# Patient Record
Sex: Female | Born: 1945 | Race: Black or African American | Hispanic: No | Marital: Single
Health system: Southern US, Community
[De-identification: ages and names within clinical notes are randomized; demographics above are authoritative.]

## PROBLEM LIST (undated history)

## (undated) DIAGNOSIS — I82419 Acute embolism and thrombosis of unspecified femoral vein: Secondary | ICD-10-CM

---

## 2004-02-10 ENCOUNTER — Inpatient Hospital Stay (HOSPITAL_COMMUNITY): Admission: EM | Admit: 2004-02-10 | Discharge: 2004-02-12 | Payer: Self-pay | Admitting: Emergency Medicine

## 2019-12-06 ENCOUNTER — Emergency Department (HOSPITAL_COMMUNITY): Payer: Medicare Other

## 2019-12-06 ENCOUNTER — Emergency Department (HOSPITAL_COMMUNITY)
Admission: EM | Admit: 2019-12-06 | Discharge: 2019-12-06 | Disposition: A | Discharge status: Home / Self Care | Payer: Medicare Other | Attending: Emergency Medicine | Admitting: Emergency Medicine

## 2019-12-06 ENCOUNTER — Encounter (HOSPITAL_COMMUNITY): Payer: Self-pay | Admitting: Obstetrics and Gynecology

## 2019-12-06 DIAGNOSIS — S82831A Other fracture of upper and lower end of right fibula, initial encounter for closed fracture: Secondary | ICD-10-CM

## 2019-12-06 DIAGNOSIS — S99922A Unspecified injury of left foot, initial encounter: Secondary | ICD-10-CM | POA: Diagnosis present

## 2019-12-06 DIAGNOSIS — W19XXXA Unspecified fall, initial encounter: Secondary | ICD-10-CM | POA: Insufficient documentation

## 2019-12-06 HISTORY — DX: Acute embolism and thrombosis of unspecified femoral vein: I82.419

## 2019-12-06 MED ORDER — HYDROCODONE-ACETAMINOPHEN 5-325 MG PO TABS: 1.0000 | ORAL_TABLET | ORAL | 0 refills | Status: AC | PRN

## 2019-12-06 NOTE — Discharge Instructions (Signed)
You were evaluated in the Emergency Department and after careful evaluation, we did not find any emergent condition requiring admission or further testing in the hospital.  Your exam/testing today was overall reassuring.  Your x-ray shows a small broken bone in your right ankle.  We recommend follow-up with the orthopedic specialist here in town to make sure this heals well.  Please use the air brace throughout the day to keep the ankle well aligned.  You can use the Norco pain medicine at night if you are having trouble sleeping due to the pain.  Please return to the Emergency Department if you experience any worsening of your condition.  Thank you for allowing [place] to be a part of your care.

## 2019-12-06 NOTE — ED Triage Notes (Signed)
Patient reports to the ER for fall. Patient reports she twisted her right ankle and hit her right foot on the step. Patient has obvious swelling to right ankle and pain in the left foot.

## 2019-12-06 NOTE — ED Provider Notes (Signed)
[facility] Emergency Department Provider Note MRN:  [id number]  Arrival date & time: 12/06/19     Chief Complaint   Fall and Ankle Pain   History of Present Illness   Barbara Adams is a 74 y.o. year-old female with a history of DVT presenting to the ED with chief complaint of fall.  Patient fell 3 days ago, stumbled and lost her footing and fell awkwardly down 1 stair.  Has been having persistent pain to the right ankle since then, mild to moderate, also with very mild pain to the left foot yesterday but not today.  Pain is worse with motion or palpation.  No other trauma, no head trauma, no dizziness preceding the fall.  Review of Systems  A problem-focused ROS was performed. Positive for ankle pain.  Patient denies head trauma.  Patient's Health History    Past Medical History:  Diagnosis Date  . 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  Social History   Socioeconomic History  . Marital status: Single    Spouse name: Not on file  . Number of children: Not on file  . Years of education: Not on file  . Highest education level: Not on file  Occupational History  . Not on file  Tobacco Use  . Smoking status: Not on file  Substance and Sexual Activity  . Alcohol use: Not on file  . Drug use: Not on file  . Sexual activity: Not on file  Other Topics Concern  . Not on file  Social History Narrative  . Not on file   Social Determinants of Health   Financial Resource Strain:   . Difficulty of Paying Living Expenses: Not on file  Food Insecurity:   . Worried About Programme researcher, broadcasting/film/video in the Last Year: Not on file  . Ran Out of Food in the Last Year: Not on file  Transportation Needs:   . Lack of Transportation (Medical): Not on file  . Lack of Transportation (Non-Medical): Not on file  Physical Activity:   . Days of Exercise per Week: Not on file  . Minutes of Exercise per Session: Not  on file  Stress:   . Feeling of Stress : Not on file  Social Connections:   . Frequency of Communication with Friends and Family: Not on file  . Frequency of Social Gatherings with Friends and Family: Not on file  . Attends Religious Services: Not on file  . Active Member of Clubs or Organizations: Not on file  . Attends Banker Meetings: Not on file  . Marital Status: Not on file  Intimate Partner Violence:   . Fear of Current or Ex-Partner: Not on file  . Emotionally Abused: Not on file  . Physically Abused: Not on file  . Sexually Abused: Not on file     Physical Exam   Vitals:   12/06/19 1341  BP: 128/83  Pulse: 72  Resp: 16  Temp: 98.1 F (36.7 C)  SpO2: 100%    CONSTITUTIONAL: Well-appearing, NAD NEURO:  Alert and oriented x 3, no focal deficits EYES:  eyes equal and reactive ENT/NECK:  no LAD, no JVD CARDIO: Regular rate, well-perfused, normal S1 and S2 PULM:  CTAB no wheezing or rhonchi GI/GU:  normal bowel sounds, non-distended, non-tender MSK/SPINE: Mild swelling and tenderness palpation to the right lateral malleolus SKIN:  no rash, atraumatic PSYCH:  Appropriate speech and behavior  *  Additional and/or pertinent findings included in MDM below  Diagnostic and Interventional Summary    EKG Interpretation  Date/Time:    Ventricular Rate:    PR Interval:    QRS Duration:   QT Interval:    QTC Calculation:   R Axis:     Text Interpretation:        Labs Reviewed - No data to display  DG Foot Complete Left  Final Result    DG Foot Complete Right  Final Result    DG Ankle Complete Right  Final Result      Medications - No data to display   Procedures  /  Critical Care Procedures  ED Course and Medical Decision Making  I have reviewed the triage vital signs, the nursing notes, and pertinent available records from the EMR.  Listed above are laboratory and imaging tests that I personally ordered, reviewed, and interpreted and  then considered in my medical decision making (see below for details).  Mechanical fall with x-ray revealing nondisplaced distal fibular fracture, neurovascularly intact extremity, appropriate for discharge with Ortho follow-up.  Patient is able to bear weight without much pain, placing an air brace/cast.       Elmer Sow. Pilar Plate, MD [facility] Emergency Medicine [facility] [email or web address]  Final Clinical Impressions(s) / ED Diagnoses     ICD-10-CM   1. Closed fracture of distal end of right fibula, unspecified fracture morphology, initial encounter  S82.831A   2. Fall  [email or web address]     ED Discharge Orders         Ordered    HYDROcodone-acetaminophen (NORCO/VICODIN) 5-325 MG tablet  Every 4 hours PRN        12/06/19 1645           Discharge Instructions Discussed with and Provided to Patient:     Discharge Instructions     You were evaluated in the Emergency Department and after careful evaluation, we did not find any emergent condition requiring admission or further testing in the hospital.  Your exam/testing today was overall reassuring.  Your x-ray shows a small broken bone in your right ankle.  We recommend follow-up with the orthopedic specialist here in town to make sure this heals well.  Please use the air brace throughout the day to keep the ankle well aligned.  You can use the Norco pain medicine at night if you are having trouble sleeping due to the pain.  Please return to the Emergency Department if you experience any worsening of your condition.  Thank you for allowing [place] to be a part of your care.       Sabas Sous, MD 12/06/19 [phone]

## 2019-12-06 NOTE — Progress Notes (Signed)
Orthopedic Tech Progress Note Patient Details:  Barbara Adams 03-18-1945 [id number]  Ortho Devices Ortho Device/Splint Location: applied cam walker RLE Ortho Device/Splint Interventions: Ordered, Application   Post Interventions Patient Tolerated: Well Instructions Provided: Care of device   Jennye Moccasin 12/06/2019, 5:08 PM

## 2021-10-12 IMAGING — CR DG FOOT COMPLETE 3+V*R*
3 series · 3 of 3 positions shown · non-contrast
Comparison: None.

CLINICAL DATA: Right foot pain after a twisting injury and fall on
step. Initial encounter.

EXAM:
RIGHT FOOT COMPLETE - 3+ VIEW

[x foot ap right]
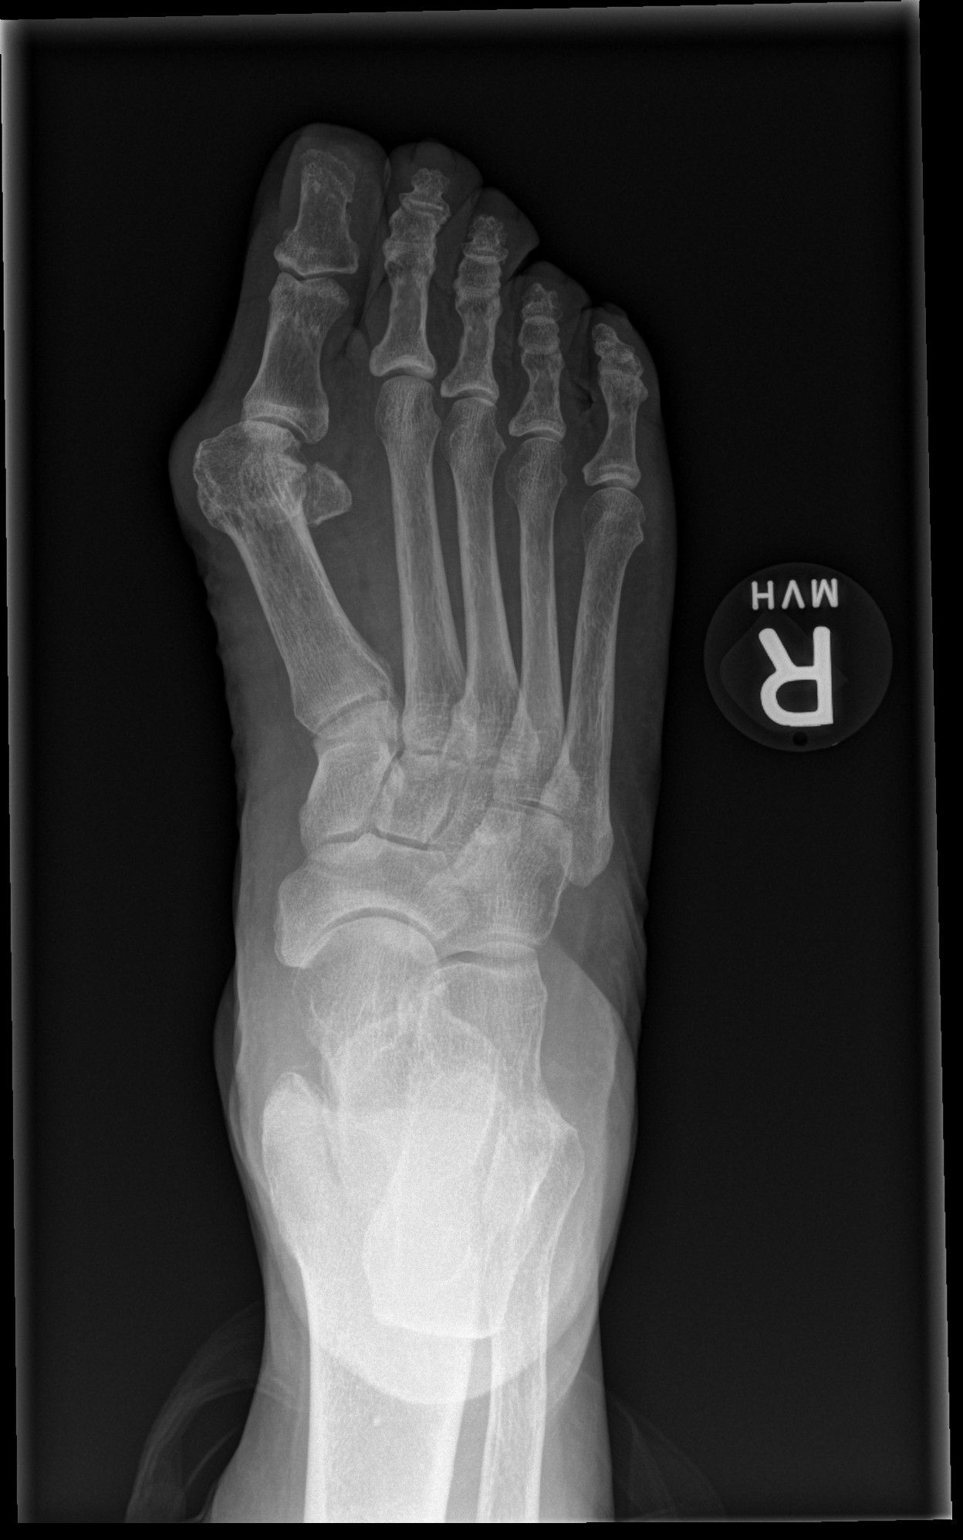

[x foot obl right]
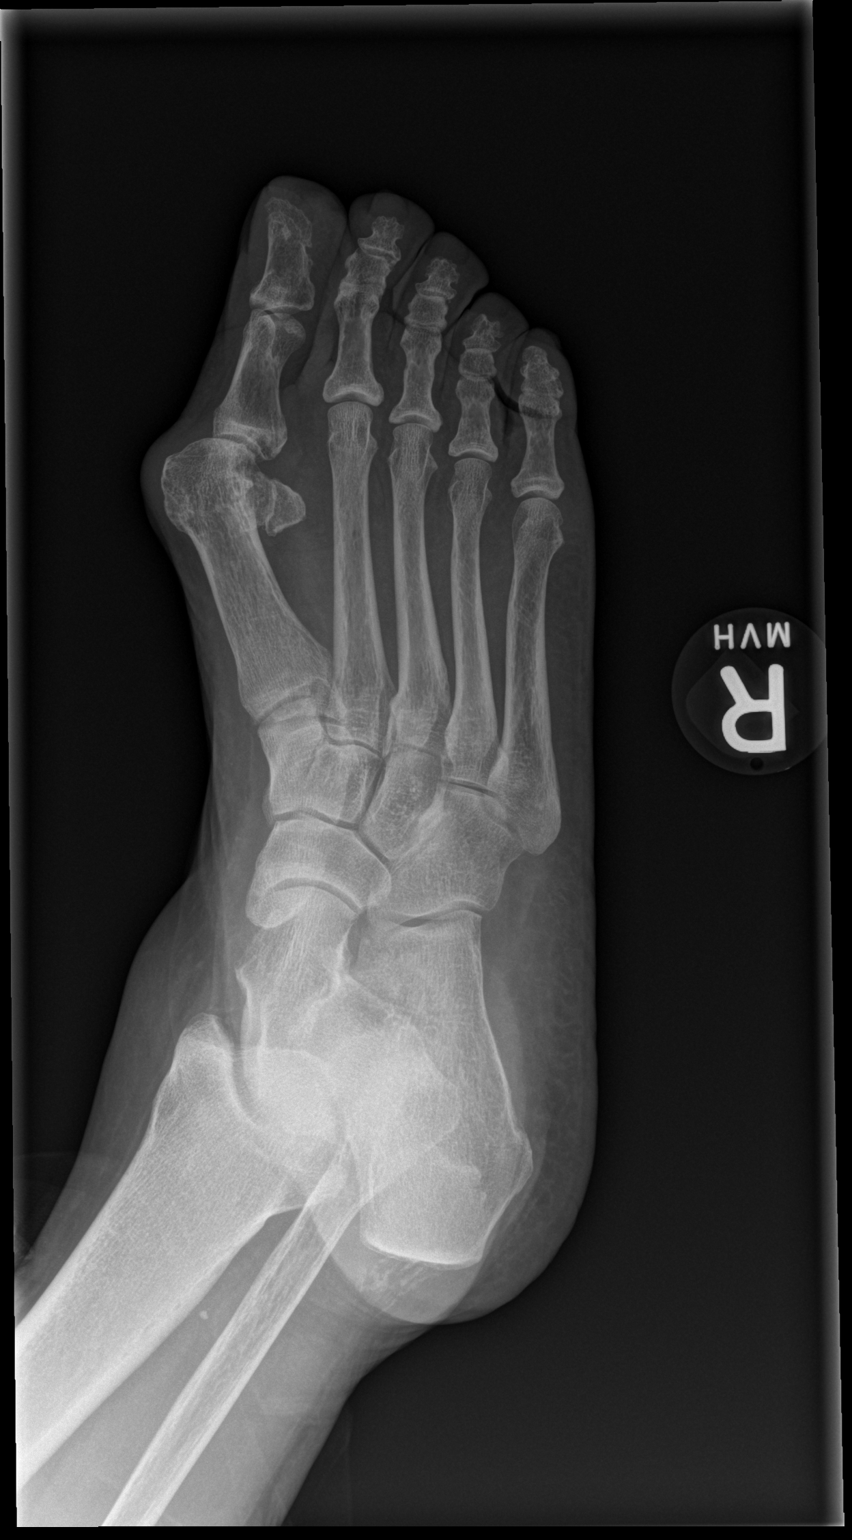

[x foot lat right]
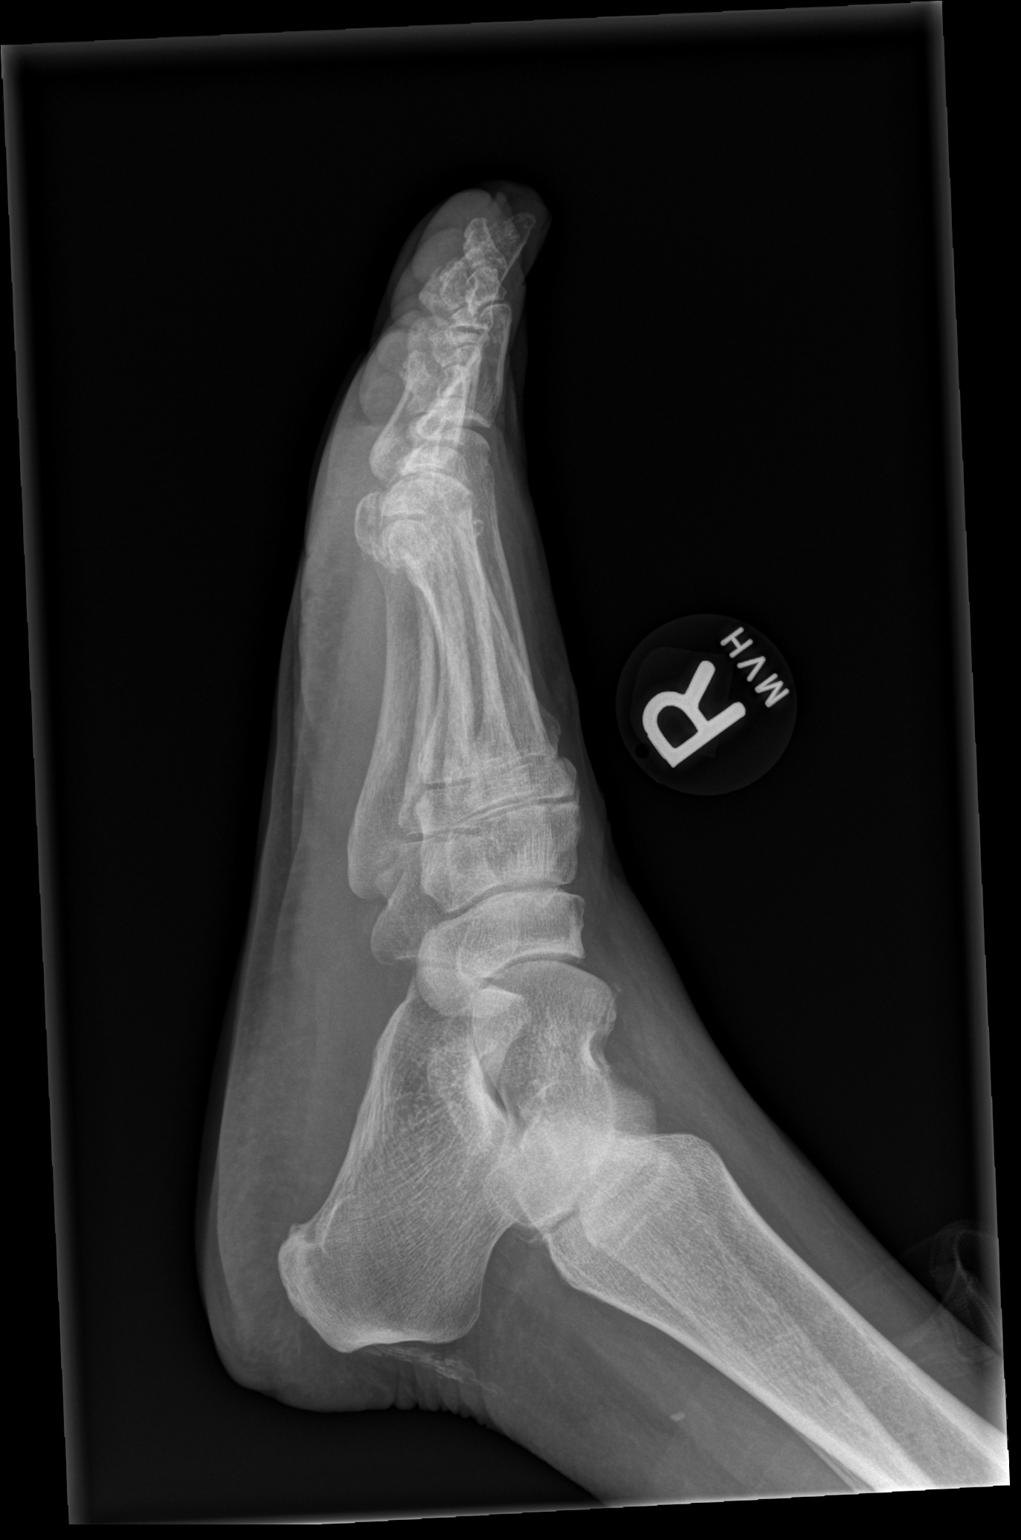

[3 of 3 positions shown; findings below may reference images not displayed]

FINDINGS: There is no acute bony or joint abnormality. Hallux valgus deformity
is seen. Mild midfoot osteoarthritis is noted. There are some
calcification in the distal Achilles tendon consistent with
chronic/remote tendinosis.
IMPRESSION: No acute abnormality.

Hallux valgus.

Mild midfoot degenerative change.

Findings compatible with chronic/remote Achilles tendinosis.

## 2021-10-12 IMAGING — CR DG FOOT COMPLETE 3+V*L*
3 series · 3 of 3 positions shown · non-contrast
Comparison: None.

CLINICAL DATA: Left foot pain after a fall.  Initial encounter.

EXAM:
LEFT FOOT - COMPLETE 3+ VIEW

[x foot ap left]
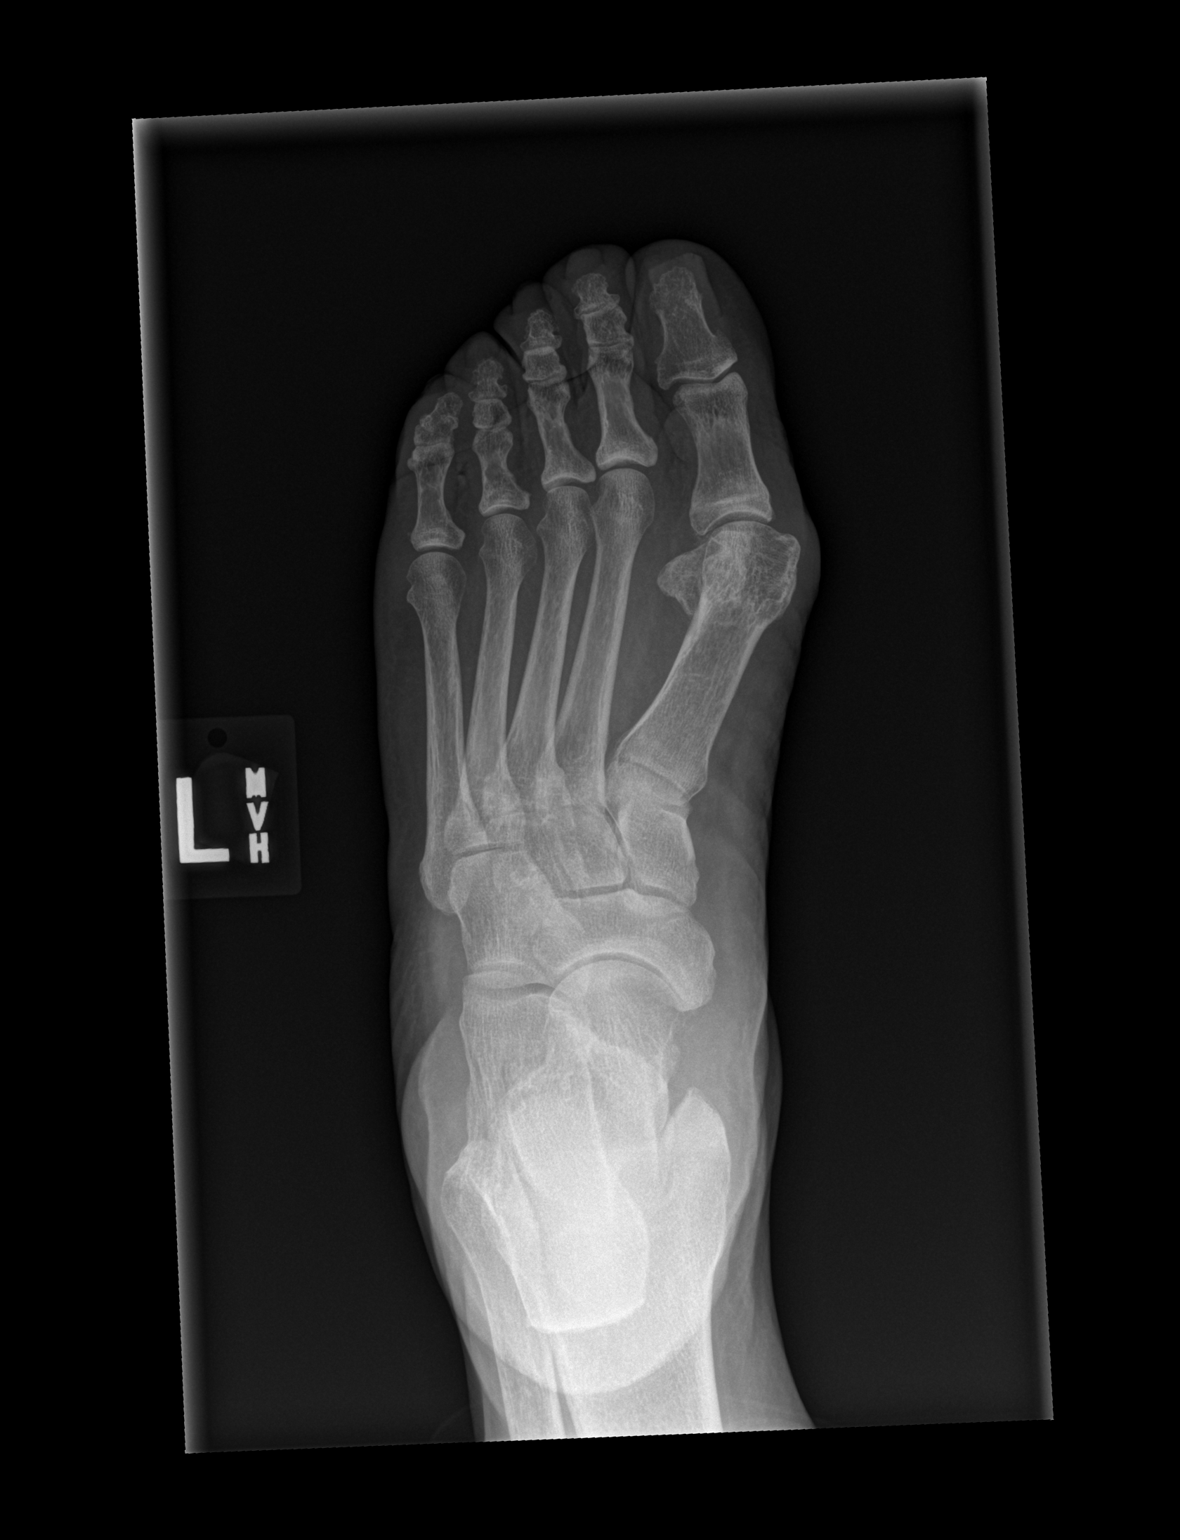

[x foot obl left]
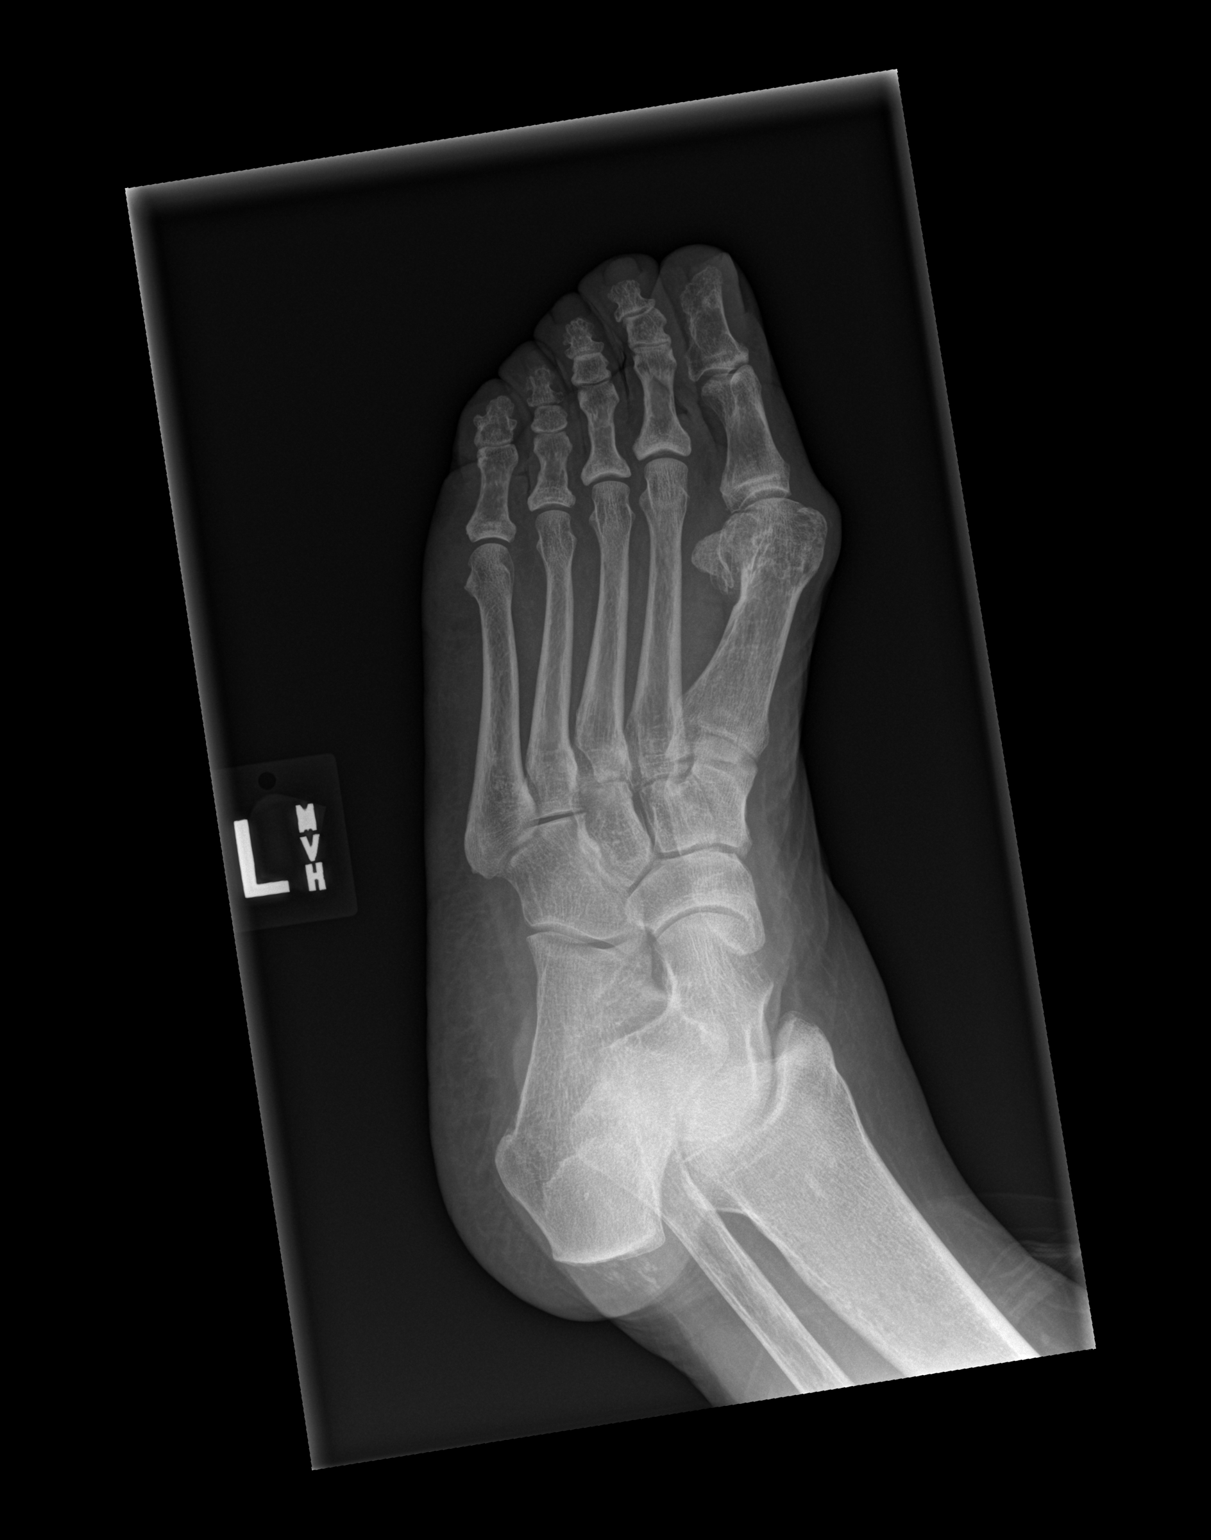

[x foot lat left]
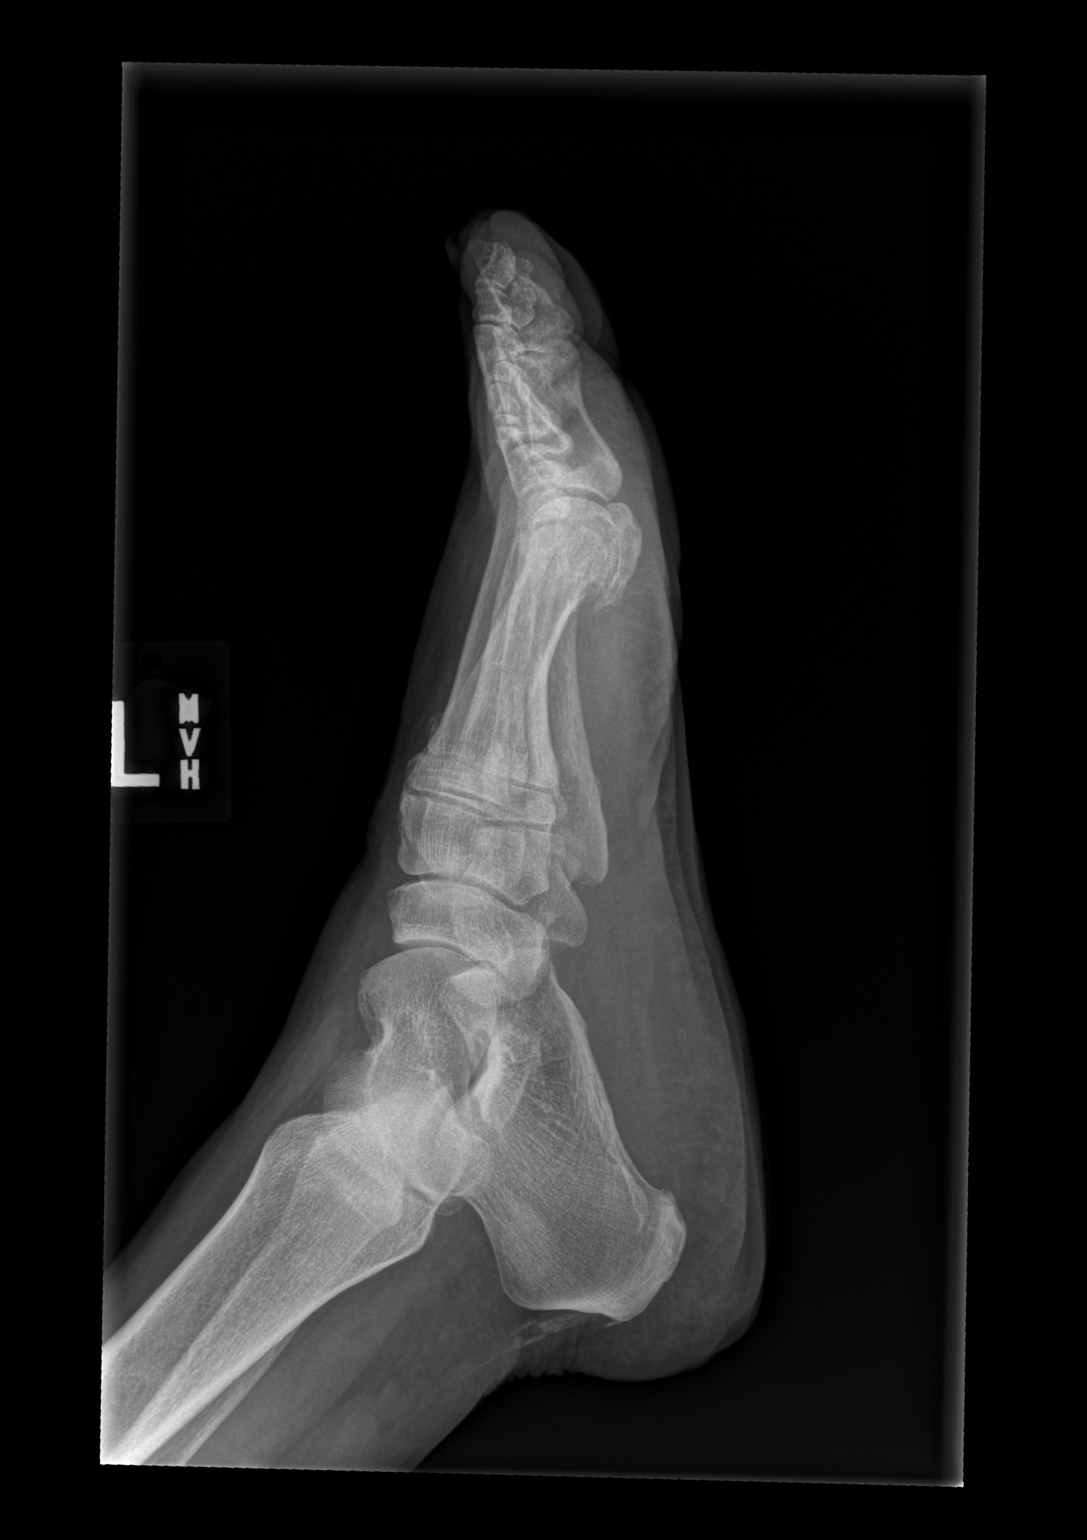

[3 of 3 positions shown; findings below may reference images not displayed]

FINDINGS: There is no acute bony or joint abnormality. Hallux valgus deformity
is noted. Mild midfoot osteoarthritis is seen. There are some
calcifications in the distal Achilles tendon consistent with chronic
tendinopathy. Soft tissues are otherwise unremarkable.
IMPRESSION: No acute abnormality.

Hallux valgus.

Findings compatible with chronic/remote Achilles tendinosis.
